# Patient Record
Sex: Male | Born: 1950 | Race: White | Hispanic: No | Marital: Married | Smoking: Former smoker
Health system: Southern US, Community
[De-identification: ages and names within clinical notes are randomized; demographics above are authoritative.]

## PROBLEM LIST (undated history)

## (undated) DIAGNOSIS — C801 Malignant (primary) neoplasm, unspecified: Secondary | ICD-10-CM

## (undated) DIAGNOSIS — I219 Acute myocardial infarction, unspecified: Secondary | ICD-10-CM

## (undated) DIAGNOSIS — I10 Essential (primary) hypertension: Secondary | ICD-10-CM

## (undated) HISTORY — PX: ACHILLES TENDON SURGERY: SHX542

## (undated) HISTORY — PX: KNEE ARTHROSCOPY: SHX127

---

## 1999-05-05 DIAGNOSIS — C801 Malignant (primary) neoplasm, unspecified: Secondary | ICD-10-CM

## 1999-05-05 HISTORY — PX: OTHER SURGICAL HISTORY: SHX169

## 1999-05-05 HISTORY — DX: Malignant (primary) neoplasm, unspecified: C80.1

## 2003-06-28 ENCOUNTER — Ambulatory Visit (HOSPITAL_COMMUNITY): Admission: RE | Admit: 2003-06-28 | Discharge: 2003-06-28 | Payer: Self-pay | Admitting: Gastroenterology

## 2009-02-27 ENCOUNTER — Encounter: Admission: RE | Admit: 2009-02-27 | Discharge: 2009-02-27 | Payer: Self-pay | Admitting: Gastroenterology

## 2010-09-19 NOTE — Op Note (Signed)
NAME:  Andres Richard, Andres Richard                            ACCOUNT NO.:  [id number]   MEDICAL RECORD NO.:  [id number]                   PATIENT TYPE:  AMB   LOCATION:  ENDO                                 FACILITY:  [facility]   PHYSICIAN:  Danise Edge, M.D.                DATE OF BIRTH:  [birth year]   DATE OF PROCEDURE:  06/28/2003  DATE OF DISCHARGE:                                 OPERATIVE REPORT   PROCEDURE:  Screening colonoscopy.   INDICATIONS FOR PROCEDURE:  Mr. Andres Richard is a 60 year old male born  May 24, 1950.  Mr. Andres Richard is scheduled to undergo his first screening  colonoscopy with polypectomy to prevent colon cancer.   ENDOSCOPIST:  Danise Edge, M.D.   PREMEDICATION:  Versed 9.5 mg, Demerol 50 mg.   DESCRIPTION OF PROCEDURE:  After obtaining informed consent, Mr. Andres Richard was  placed in the left lateral decubitus position. I administered intravenous  Demerol and intravenous Versed to achieve conscious sedation for the  procedure. The patient's blood pressure, oxygen saturation and cardiac  rhythm were monitored throughout the procedure and documented in the medical  record.   Anal inspection was normal. Digital rectal examination revealed an absent  prostate. In January 2002, Mr. Andres Richard underwent a radical prostatectomy to  treat prostate cancer.  The Olympus adjustable pediatric colonoscope was  introduced into the rectum and advanced to the cecum. Colonic preparation  for the exam today was excellent.   Mr. Andres Richard has universal colonic diverticulosis without diverticulitis or  diverticular stricture formation.   RECTUM:  Normal.   SIGMOID COLON AND DESCENDING COLON:  Normal.   SPLENIC FLEXURE:  Normal.   TRANSVERSE COLON:  Normal.   HEPATIC FLEXURE:  Normal.   ASCENDING COLON:  Normal.   CECUM AND ILEOCECAL VALVE:  Normal.   ASSESSMENT:  Normal screening proctocolonoscopy to the cecum.  No endoscopic  evidence for the presence of colorectal neoplasia.  Universal  colonic  diverticulosis without diverticulitis or diverticular stricture formation  present.                                               Danise Edge, M.D.    MJ/MEDQ  D:  06/28/2003  T:  06/28/2003  Job:  [id number]   cc:   B. Hamrick, M.D.  [facility], [place]

## 2014-01-29 ENCOUNTER — Other Ambulatory Visit (HOSPITAL_COMMUNITY): Payer: Self-pay | Admitting: Orthopedic Surgery

## 2014-01-30 ENCOUNTER — Encounter (HOSPITAL_COMMUNITY): Payer: Self-pay | Admitting: Pharmacist

## 2014-02-01 ENCOUNTER — Other Ambulatory Visit (HOSPITAL_COMMUNITY): Payer: Self-pay | Admitting: *Deleted

## 2014-02-01 NOTE — Pre-Procedure Instructions (Addendum)
Andres Richard  02/01/2014   Your procedure is scheduled on:  Monday, February 05, 2014    Report to [facility] Entrance "A" Admitting Office at 9:00 AM.   Call this number if you have problems the morning of surgery: [phone]   Remember:   Do not eat food or drink liquids after midnight Sunday, 02/04/14.   Take these medicines the morning of surgery with A SIP OF WATER: atenolol (TENORMIN)  Stop Aspirin as of today.STOP all herbel meds, nsaids (aleve,naproxen,advil,ibuprofen) 5 days prior to surgery including vitamins now   Do not wear jewelry.  Do not wear lotions, powders, or cologne. You may wear deodorant.  Men may shave face and neck.  Do not bring valuables to the hospital.  [facility] is not responsible                  for any belongings or valuables.               Contacts, dentures or bridgework may not be worn into surgery.  Leave suitcase in the car. After surgery it may be brought to your room.  For patients admitted to the hospital, discharge time is determined by your                treatment team.               Patients discharged the day of surgery will not be allowed to drive home.    Special Instructions: Andres Richard - Preparing for Surgery  Before surgery, you can play an important role.  Because skin is not sterile, your skin needs to be as free of germs as possible.  You can reduce the number of germs on you skin by washing with CHG (chlorahexidine gluconate) soap before surgery.  CHG is an antiseptic cleaner which kills germs and bonds with the skin to continue killing germs even after washing.  Please DO NOT use if you have an allergy to CHG or antibacterial soaps.  If your skin becomes reddened/irritated stop using the CHG and inform your nurse when you arrive at Short Stay.  Do not shave (including legs and underarms) for at least 48 hours prior to the first CHG shower.  You may shave your face.  Please follow these instructions carefully:   1.   Shower with CHG Soap the night before surgery and the                                morning of Surgery.  2.  If you choose to wash your hair, wash your hair first as usual with your       normal shampoo.  3.  After you shampoo, rinse your hair and body thoroughly to remove the                      Shampoo.  4.  Use CHG as you would any other liquid soap.  You can apply chg directly       to the skin and wash gently with scrungie or a clean washcloth.  5.  Apply the CHG Soap to your body ONLY FROM THE NECK DOWN.        Do not use on open wounds or open sores.  Avoid contact with your eyes, ears, mouth and genitals (private parts).  Wash genitals (private parts) with your normal soap.  6.  Wash thoroughly, paying special attention to the area where your surgery        will be performed.  7.  Thoroughly rinse your body with warm water from the neck down.  8.  DO NOT shower/wash with your normal soap after using and rinsing off       the CHG Soap.  9.  Pat yourself dry with a clean towel.            10.  Wear clean pajamas.            11.  Place clean sheets on your bed the night of your first shower and do not        sleep with pets.  Day of Surgery  Do not apply any lotions the morning of surgery.  Please wear clean clothes to the hospital/surgery center.     Please read over the following fact sheets that you were given: Pain Booklet, Coughing and Deep Breathing and Surgical Site Infection Prevention

## 2014-02-02 ENCOUNTER — Ambulatory Visit (HOSPITAL_COMMUNITY)
Admission: RE | Admit: 2014-02-02 | Discharge: 2014-02-02 | Disposition: A | Discharge status: Home / Self Care | Payer: 59 | Source: Ambulatory Visit | Attending: Anesthesiology | Admitting: Anesthesiology

## 2014-02-02 ENCOUNTER — Encounter (HOSPITAL_COMMUNITY): Payer: Self-pay

## 2014-02-02 ENCOUNTER — Encounter (HOSPITAL_COMMUNITY)
Admission: RE | Admit: 2014-02-02 | Discharge: 2014-02-02 | Disposition: A | Discharge status: Home / Self Care | Payer: 59 | Source: Ambulatory Visit | Attending: Orthopedic Surgery | Admitting: Orthopedic Surgery

## 2014-02-02 DIAGNOSIS — Z01818 Encounter for other preprocedural examination: Secondary | ICD-10-CM | POA: Diagnosis present

## 2014-02-02 DIAGNOSIS — S83206A Unspecified tear of unspecified meniscus, current injury, right knee, initial encounter: Secondary | ICD-10-CM | POA: Diagnosis not present

## 2014-02-02 DIAGNOSIS — X58XXXD Exposure to other specified factors, subsequent encounter: Secondary | ICD-10-CM | POA: Insufficient documentation

## 2014-02-02 DIAGNOSIS — I10 Essential (primary) hypertension: Secondary | ICD-10-CM | POA: Diagnosis not present

## 2014-02-02 HISTORY — DX: Acute myocardial infarction, unspecified: I21.9

## 2014-02-02 HISTORY — DX: Essential (primary) hypertension: I10

## 2014-02-02 HISTORY — DX: Malignant (primary) neoplasm, unspecified: C80.1

## 2014-02-02 LAB — CBC
HCT: 45.4 % (ref 39.0–52.0)
HEMOGLOBIN: 15.7 g/dL (ref 13.0–17.0)
MCH: 31.2 pg (ref 26.0–34.0)
MCHC: 34.6 g/dL (ref 30.0–36.0)
MCV: 90.3 fL (ref 78.0–100.0)
Platelets: 284 10*3/uL (ref 150–400)
RBC: 5.03 MIL/uL (ref 4.22–5.81)
RDW: 13.3 % (ref 11.5–15.5)
WBC: 7 10*3/uL (ref 4.0–10.5)

## 2014-02-02 LAB — BASIC METABOLIC PANEL
Anion gap: 8 (ref 5–15)
BUN: 12 mg/dL (ref 6–23)
CALCIUM: 8.7 mg/dL (ref 8.4–10.5)
CO2: 28 meq/L (ref 19–32)
Chloride: 104 mEq/L (ref 96–112)
Creatinine, Ser: 0.88 mg/dL (ref 0.50–1.35)
GFR calc Af Amer: >90 mL/min (ref >90)
GFR calc non Af Amer: 90 mL/min — ABNORMAL LOW (ref >90)
GLUCOSE: 102 mg/dL — AB (ref 70–99)
POTASSIUM: 4.5 meq/L (ref 3.7–5.3)
Sodium: 140 mEq/L (ref 137–147)

## 2014-02-02 NOTE — Progress Notes (Addendum)
Patient stated had mi 25 yrs ago in [place]. No cardiologist or heart problems since came to [place] 12 yrs ago. req' notes, any cardiac notes ,tests ,ekg  from dr Daiva Eves at [facility]  in [phone] ph [phone] fax

## 2014-02-02 NOTE — Progress Notes (Signed)
Anesthesia Chart Review:  Pt is 63 year old male scheduled for R knee arthroscopy, debridement vs meniscal repair on 02/05/14 with Dr. Marlou Sa.   PMH: MI in the [place] 25 years ago, HTN, hyperlipidemia,  prostate cancer.   Medications include: ASA, atentolol, lipitor  Preoperative labs reviewed.    Chest x-ray reviewed.   EKG: sinus bradycardia, 48 bpm  PCP is Hamrick at [facility] in [place]. Has not seen cardiologist and denies heart problems since has been in this country for last 12 years.   Contacted PCP's office. No EKG, stress test, or echo on record.  Last office visit with PCP was 03/2013. By notes from last office visit, pt has hx CAD, "mild by cardiac catheterization years ago, he's never had any interventions". MI was dx by elevated cardiac enzymes.   Pt will need further eval by his assigned anesthesiologist on DOS.   Willeen Cass, FNP-BC [facility] Short Stay Surgical Center/Anesthesiology Phone: [phone] 02/02/2014 4:03 PM

## 2014-02-04 MED ORDER — CHLORHEXIDINE GLUCONATE 4 % EX LIQD
60.0000 mL | Freq: Once | CUTANEOUS | Status: DC
  Filled 2014-02-04: qty 60

## 2014-02-04 MED ORDER — CEFAZOLIN SODIUM-DEXTROSE 2-3 GM-% IV SOLR
2.0000 g | INTRAVENOUS | Status: AC
  Administered 2014-02-05: 2 g via INTRAVENOUS

## 2014-02-05 ENCOUNTER — Encounter (HOSPITAL_COMMUNITY): Payer: Self-pay | Admitting: *Deleted

## 2014-02-05 ENCOUNTER — Ambulatory Visit (HOSPITAL_COMMUNITY)
Admission: RE | Admit: 2014-02-05 | Discharge: 2014-02-05 | Disposition: A | Discharge status: Home / Self Care | Payer: 59 | Source: Ambulatory Visit | Attending: Orthopedic Surgery | Admitting: Orthopedic Surgery

## 2014-02-05 ENCOUNTER — Encounter (HOSPITAL_COMMUNITY)
Admission: RE | Disposition: A | Discharge status: Home / Self Care | Payer: Self-pay | Source: Ambulatory Visit | Attending: Orthopedic Surgery

## 2014-02-05 ENCOUNTER — Encounter (HOSPITAL_COMMUNITY): Payer: 59 | Admitting: Emergency Medicine

## 2014-02-05 ENCOUNTER — Ambulatory Visit (HOSPITAL_COMMUNITY): Payer: 59 | Admitting: Anesthesiology

## 2014-02-05 DIAGNOSIS — Z8546 Personal history of malignant neoplasm of prostate: Secondary | ICD-10-CM | POA: Insufficient documentation

## 2014-02-05 DIAGNOSIS — Z79899 Other long term (current) drug therapy: Secondary | ICD-10-CM | POA: Insufficient documentation

## 2014-02-05 DIAGNOSIS — X58XXXA Exposure to other specified factors, initial encounter: Secondary | ICD-10-CM | POA: Insufficient documentation

## 2014-02-05 DIAGNOSIS — Z7982 Long term (current) use of aspirin: Secondary | ICD-10-CM | POA: Diagnosis not present

## 2014-02-05 DIAGNOSIS — S83241A Other tear of medial meniscus, current injury, right knee, initial encounter: Secondary | ICD-10-CM | POA: Insufficient documentation

## 2014-02-05 DIAGNOSIS — I10 Essential (primary) hypertension: Secondary | ICD-10-CM | POA: Insufficient documentation

## 2014-02-05 DIAGNOSIS — Y929 Unspecified place or not applicable: Secondary | ICD-10-CM | POA: Insufficient documentation

## 2014-02-05 DIAGNOSIS — I252 Old myocardial infarction: Secondary | ICD-10-CM | POA: Diagnosis not present

## 2014-02-05 DIAGNOSIS — S83242A Other tear of medial meniscus, current injury, left knee, initial encounter: Secondary | ICD-10-CM

## 2014-02-05 DIAGNOSIS — Z87891 Personal history of nicotine dependence: Secondary | ICD-10-CM | POA: Diagnosis not present

## 2014-02-05 HISTORY — PX: KNEE ARTHROSCOPY WITH MENISCAL REPAIR: SHX5653

## 2014-02-05 SURGERY — ARTHROSCOPY, KNEE, WITH MENISCUS REPAIR
Anesthesia: General | Site: Knee | Laterality: Right

## 2014-02-05 MED ORDER — LIDOCAINE HCL (CARDIAC) 20 MG/ML IV SOLN
INTRAVENOUS | Status: DC | PRN
  Administered 2014-02-05: 80 mg via INTRAVENOUS

## 2014-02-05 MED ORDER — BUPIVACAINE HCL (PF) 0.25 % IJ SOLN
INTRAMUSCULAR | Status: DC | PRN
  Administered 2014-02-05: 10 mL

## 2014-02-05 MED ORDER — PROPOFOL 10 MG/ML IV BOLUS
INTRAVENOUS | Status: AC
  Filled 2014-02-05: qty 20

## 2014-02-05 MED ORDER — PROPOFOL 10 MG/ML IV BOLUS
INTRAVENOUS | Status: DC | PRN
  Administered 2014-02-05: 20 mg via INTRAVENOUS
  Administered 2014-02-05: 30 mg via INTRAVENOUS
  Administered 2014-02-05: 190 mg via INTRAVENOUS

## 2014-02-05 MED ORDER — HYDROMORPHONE HCL 1 MG/ML IJ SOLN: 0.2500 mg | INTRAMUSCULAR | Status: DC | PRN

## 2014-02-05 MED ORDER — DEXTROSE 5 % IV SOLN
INTRAVENOUS | Status: DC | PRN
  Administered 2014-02-05: 12:00:00 via INTRAVENOUS

## 2014-02-05 MED ORDER — CLONIDINE HCL (ANALGESIA) 100 MCG/ML EP SOLN
EPIDURAL | Status: DC | PRN
  Administered 2014-02-05: 1 mL

## 2014-02-05 MED ORDER — MORPHINE SULFATE 4 MG/ML IJ SOLN
INTRAMUSCULAR | Status: DC | PRN
  Administered 2014-02-05: 8 mg

## 2014-02-05 MED ORDER — OXYCODONE-ACETAMINOPHEN 10-325 MG PO TABS: 1.0000 | ORAL_TABLET | Freq: Four times a day (QID) | ORAL | Status: DC | PRN

## 2014-02-05 MED ORDER — ONDANSETRON HCL 4 MG/2ML IJ SOLN
INTRAMUSCULAR | Status: DC | PRN
  Administered 2014-02-05: 4 mg via INTRAVENOUS

## 2014-02-05 MED ORDER — OXYCODONE HCL 5 MG/5ML PO SOLN: 5.0000 mg | Freq: Once | ORAL | Status: DC | PRN

## 2014-02-05 MED ORDER — PROMETHAZINE HCL 25 MG/ML IJ SOLN: 6.2500 mg | INTRAMUSCULAR | Status: DC | PRN

## 2014-02-05 MED ORDER — ARTIFICIAL TEARS OP OINT
TOPICAL_OINTMENT | OPHTHALMIC | Status: AC
  Filled 2014-02-05: qty 3.5

## 2014-02-05 MED ORDER — OXYCODONE HCL 5 MG PO TABS: 5.0000 mg | ORAL_TABLET | Freq: Once | ORAL | Status: DC | PRN

## 2014-02-05 MED ORDER — LACTATED RINGERS IV SOLN
INTRAVENOUS | Status: DC
  Administered 2014-02-05: 09:00:00 via INTRAVENOUS

## 2014-02-05 MED ORDER — LACTATED RINGERS IV SOLN
INTRAVENOUS | Status: DC | PRN
  Administered 2014-02-05 (×2): via INTRAVENOUS

## 2014-02-05 MED ORDER — ARTIFICIAL TEARS OP OINT
TOPICAL_OINTMENT | OPHTHALMIC | Status: DC | PRN
  Administered 2014-02-05: 1 via OPHTHALMIC

## 2014-02-05 MED ORDER — MORPHINE SULFATE 4 MG/ML IJ SOLN
INTRAMUSCULAR | Status: AC
  Filled 2014-02-05: qty 2

## 2014-02-05 MED ORDER — ROPIVACAINE HCL 5 MG/ML IJ SOLN
INTRAMUSCULAR | Status: DC | PRN
Start: 2014-02-05 — End: 2014-02-05
  Administered 2014-02-05: 30 mL via PERINEURAL

## 2014-02-05 MED ORDER — FENTANYL CITRATE 0.05 MG/ML IJ SOLN
INTRAMUSCULAR | Status: AC
  Filled 2014-02-05: qty 5

## 2014-02-05 MED ORDER — CLONIDINE HCL (ANALGESIA) 100 MCG/ML EP SOLN
150.0000 ug | EPIDURAL | Status: AC
  Administered 2014-02-05: 1 mL via INTRA_ARTICULAR
  Filled 2014-02-05: qty 1.5

## 2014-02-05 MED ORDER — BUPIVACAINE HCL (PF) 0.25 % IJ SOLN
INTRAMUSCULAR | Status: AC
  Filled 2014-02-05: qty 30

## 2014-02-05 MED ORDER — FENTANYL CITRATE 0.05 MG/ML IJ SOLN
INTRAMUSCULAR | Status: AC
  Filled 2014-02-05: qty 2

## 2014-02-05 MED ORDER — MIDAZOLAM HCL 2 MG/2ML IJ SOLN
INTRAMUSCULAR | Status: AC
  Filled 2014-02-05: qty 2

## 2014-02-05 MED ORDER — SODIUM CHLORIDE 0.9 % IR SOLN
Status: DC | PRN
  Administered 2014-02-05 (×2): 3000 mL

## 2014-02-05 MED ORDER — ROPIVACAINE HCL 5 MG/ML IJ SOLN
Freq: Once | INTRAMUSCULAR | Status: DC
  Filled 2014-02-05: qty 30

## 2014-02-05 MED ORDER — LIDOCAINE HCL (CARDIAC) 20 MG/ML IV SOLN
INTRAVENOUS | Status: AC
  Filled 2014-02-05: qty 5

## 2014-02-05 SURGICAL SUPPLY — 56 items
BANDAGE ELASTIC 4 VELCRO ST LF (GAUZE/BANDAGES/DRESSINGS) ×3 IMPLANT
BANDAGE ELASTIC 6 VELCRO ST LF (GAUZE/BANDAGES/DRESSINGS) ×3 IMPLANT
BANDAGE ESMARK 6X9 LF (GAUZE/BANDAGES/DRESSINGS) IMPLANT
BLADE CUDA 5.5 (BLADE) IMPLANT
BLADE CUTTER GATOR 3.5 (BLADE) ×3 IMPLANT
BLADE GREAT WHITE 4.2 (BLADE) ×2 IMPLANT
BLADE GREAT WHITE 4.2MM (BLADE) ×1
BLADE SURG ROTATE 9660 (MISCELLANEOUS) IMPLANT
BNDG ESMARK 6X9 LF (GAUZE/BANDAGES/DRESSINGS)
BUR OVAL 6.0 (BURR) IMPLANT
COVER MAYO STAND STRL (DRAPES) ×3 IMPLANT
COVER SURGICAL LIGHT HANDLE (MISCELLANEOUS) ×3 IMPLANT
CUFF TOURNIQUET SINGLE 34IN LL (TOURNIQUET CUFF) ×3 IMPLANT
CUFF TOURNIQUET SINGLE 44IN (TOURNIQUET CUFF) IMPLANT
DRAPE ARTHROSCOPY W/POUCH 114 (DRAPES) ×3 IMPLANT
DRAPE INCISE IOBAN 66X45 STRL (DRAPES) ×3 IMPLANT
DRAPE PROXIMA HALF (DRAPES) IMPLANT
DRAPE U-SHAPE 47X51 STRL (DRAPES) ×3 IMPLANT
DRSG PAD ABDOMINAL 8X10 ST (GAUZE/BANDAGES/DRESSINGS) ×3 IMPLANT
DURAPREP 26ML APPLICATOR (WOUND CARE) ×3 IMPLANT
ELECT REM PT RETURN 9FT ADLT (ELECTROSURGICAL) ×3
ELECTRODE REM PT RTRN 9FT ADLT (ELECTROSURGICAL) ×1 IMPLANT
GAUZE SPONGE 4X4 12PLY STRL (GAUZE/BANDAGES/DRESSINGS) IMPLANT
GAUZE XEROFORM 1X8 LF (GAUZE/BANDAGES/DRESSINGS) ×3 IMPLANT
GLOVE BIOGEL PI IND STRL 8 (GLOVE) ×1 IMPLANT
GLOVE BIOGEL PI INDICATOR 8 (GLOVE) ×2
GLOVE SURG ORTHO 8.0 STRL STRW (GLOVE) ×3 IMPLANT
GOWN STRL REUS W/ TWL LRG LVL3 (GOWN DISPOSABLE) ×2 IMPLANT
GOWN STRL REUS W/ TWL XL LVL3 (GOWN DISPOSABLE) ×1 IMPLANT
GOWN STRL REUS W/TWL LRG LVL3 (GOWN DISPOSABLE) ×4
GOWN STRL REUS W/TWL XL LVL3 (GOWN DISPOSABLE) ×2
KIT BASIN OR (CUSTOM PROCEDURE TRAY) ×3 IMPLANT
KIT ROOM TURNOVER OR (KITS) ×3 IMPLANT
MANIFOLD NEPTUNE II (INSTRUMENTS) IMPLANT
NEEDLE 18GX1X1/2 (RX/OR ONLY) (NEEDLE) IMPLANT
NEEDLE HYPO 25GX1X1/2 BEV (NEEDLE) ×3 IMPLANT
NS IRRIG 1000ML POUR BTL (IV SOLUTION) IMPLANT
PACK ARTHROSCOPY DSU (CUSTOM PROCEDURE TRAY) ×3 IMPLANT
PAD ARMBOARD 7.5X6 YLW CONV (MISCELLANEOUS) ×6 IMPLANT
PADDING CAST COTTON 6X4 STRL (CAST SUPPLIES) ×6 IMPLANT
PENCIL BUTTON HOLSTER BLD 10FT (ELECTRODE) ×3 IMPLANT
SET ARTHROSCOPY TUBING (MISCELLANEOUS) ×2
SET ARTHROSCOPY TUBING LN (MISCELLANEOUS) ×1 IMPLANT
SPONGE GAUZE 4X4 12PLY STER LF (GAUZE/BANDAGES/DRESSINGS) ×3 IMPLANT
SPONGE LAP 4X18 X RAY DECT (DISPOSABLE) ×3 IMPLANT
SUT ETHILON 3 0 PS 1 (SUTURE) ×3 IMPLANT
SUT MENISCAL KIT (KITS) IMPLANT
SYR 20ML ECCENTRIC (SYRINGE) ×3 IMPLANT
SYR CONTROL 10ML LL (SYRINGE) IMPLANT
SYR TB 1ML LUER SLIP (SYRINGE) ×3 IMPLANT
TOWEL OR 17X24 6PK STRL BLUE (TOWEL DISPOSABLE) ×3 IMPLANT
TOWEL OR 17X26 10 PK STRL BLUE (TOWEL DISPOSABLE) ×3 IMPLANT
TUBE CONNECTING 12'X1/4 (SUCTIONS) ×1
TUBE CONNECTING 12X1/4 (SUCTIONS) ×2 IMPLANT
WAND HAND CNTRL MULTIVAC 90 (MISCELLANEOUS) IMPLANT
WATER STERILE IRR 1000ML POUR (IV SOLUTION) ×3 IMPLANT

## 2014-02-05 NOTE — H&P (Signed)
Andres Richard is an 63 y.o. male.   Chief Complaint: right knee pain HPI: pt describes multiple month h/o right knee pain and mechanical sxs refractory to non op mngmt. He does describe med JLT  And locking sxs - mri shows predominantly vertical tera involving substantial part of meniscus  Past Medical History  Diagnosis Date  . Myocardial infarction     25 yrs ago in [place]  . Hypertension   . Cancer     prostate    Past Surgical History  Procedure Laterality Date  . Prostate ca  02  . Achilles tendon surgery Bilateral     11-11 yrs old  . Knee arthroscopy Bilateral     ? dates    History reviewed. No pertinent family history. Social History:  reports that he quit smoking about 26 years ago. He does not have any smokeless tobacco history on file. He reports that he drinks about 8.4 ounces of alcohol per week. He reports that he does not use illicit drugs.  Allergies: Not on File  Medications Prior to Admission  Medication Sig Dispense Refill  . aspirin 81 MG tablet Take 81 mg by mouth daily.      Marland Kitchen atenolol (TENORMIN) 25 MG tablet Take 25 mg by mouth daily.      Marland Kitchen atorvastatin (LIPITOR) 20 MG tablet Take 20 mg by mouth every evening.        No results found for this or any previous visit (from the past 48 hour(s)). No results found.  Review of Systems  Constitutional: Negative.   HENT: Negative.   Eyes: Negative.   Respiratory: Negative.   Cardiovascular: Negative.   Gastrointestinal: Negative.   Genitourinary: Negative.   Musculoskeletal: Positive for joint pain.  Skin: Negative.   Neurological: Negative.   Endo/Heme/Allergies: Negative.   Psychiatric/Behavioral: Negative.     Blood pressure 155/73, pulse 46, temperature 97 F (36.1 C), temperature source Oral, resp. rate 20, weight 88.451 kg (195 lb), SpO2 100.00%. Physical Exam  Constitutional: He appears well-developed.  HENT:  Head: Normocephalic.  Eyes: Pupils are equal, round, and reactive to light.   Neck: Normal range of motion.  Cardiovascular: Normal rate.   Respiratory: Effort normal.  Neurological: He is alert.  Skin: Skin is warm.  Psychiatric: He has a normal mood and affect.   right knee with MJLT and pos mcmurray - extensor mech ok - dp 2/4 - ankle df pf ok - rom full - collaterals and cruciates intact  Assessment/Plan Right knee medial meniscal tear with configuration potentially amenable to repair - plan doa and debridement vs inside out repair - risks and benefits discussed with patient included but not limited to infxn - n/v damage - stiffness - and repair failure requiring further surgery - all questions answered  DEAN,GREGORY SCOTT 02/05/2014, 10:08 AM

## 2014-02-05 NOTE — Progress Notes (Signed)
Orthopedic Tech Progress Note Patient Details:  Andres Richard 12-10-1950 [id number] Crutches delivered to PACU and adjusted to patient's charted height. Patient unable to stand at this time for crutch training. Should patient need additional help with crutches PACU nurse is to call Ortho tech back once patient is able to stand. Ortho Devices Type of Ortho Device: Crutches Ortho Device/Splint Interventions: Ordered   [place] R Thompson 02/05/2014, 2:17 PM

## 2014-02-05 NOTE — Brief Op Note (Signed)
02/05/2014  1:13 PM  PATIENT:  Andres Richard  63 y.o. male  PRE-OPERATIVE DIAGNOSIS:  RIGHT KNEE MEDIAL MENISCAL TEAR  POST-OPERATIVE DIAGNOSIS:  RIGHT KNEE MEDIAL MENISCAL TEAR  PROCEDURE:  Procedure(s): KNEE ARTHROSCOPY WITH DEBRIDEMENT  SURGEON:  Surgeon(s): Meredith Pel, MD  ASSISTANT: s vernon pa  ANESTHESIA:   general  EBL: 10 ml    Total I/O In: 50 [I.V.:50] Out: -   BLOOD ADMINISTERED: none  DRAINS: none   LOCAL MEDICATIONS USED:  none  SPECIMEN:  No Specimen  COUNTS:  YES  TOURNIQUET:   Total Tourniquet Time Documented: Thigh (Right) - 52 minutes Total: Thigh (Right) - 52 minutes   DICTATION: .Other Dictation: Dictation Number [id number]  PLAN OF CARE: Discharge to home after PACU  PATIENT DISPOSITION:  PACU - hemodynamically stable

## 2014-02-05 NOTE — Anesthesia Procedure Notes (Addendum)
Anesthesia Regional Block:  Adductor canal block  Pre-Anesthetic Checklist: ,, timeout performed,, Correct Site, Correct Laterality, Correct Procedure, Correct Position, site marked, risks and benefits discussed, Surgical consent,  Pre-op evaluation,  At surgeon's request and post-op pain management   Prep: chloraprep       Needles:   Needle Type: Echogenic Needle      Needle Gauge: 21 and 21 G  Needle insertion depth: 5 cm   Additional Needles:  Procedures: ultrasound guided (picture in chart) Adductor canal block Narrative:  Start time: 02/05/2014 11:00 AM End time: 02/05/2014 11:15 AM Injection made incrementally with aspirations every 5 mL.  Performed by: Personally  Anesthesiologist: T Massagee  Additional Notes: Tolerated well   Procedure Name: LMA Insertion Date/Time: 02/05/2014 11:37 AM Performed by: Williemae Area B Pre-anesthesia Checklist: Patient identified, Emergency Drugs available, Suction available and Patient being monitored Patient Re-evaluated:Patient Re-evaluated prior to inductionOxygen Delivery Method: Circle system utilized Preoxygenation: Pre-oxygenation with 100% oxygen Intubation Type: IV induction Ventilation: Mask ventilation without difficulty LMA: LMA inserted LMA Size: 5.0 Number of attempts: 1 Placement Confirmation: breath sounds checked- equal and bilateral and positive ETCO2 Tube secured with: Tape (taped across cheeks) Dental Injury: Teeth and Oropharynx as per pre-operative assessment

## 2014-02-05 NOTE — Discharge Instructions (Signed)

## 2014-02-05 NOTE — Transfer of Care (Signed)
Immediate Anesthesia Transfer of Care Note  Patient: Andres Richard  Procedure(s) Performed: Procedure(s): KNEE ARTHROSCOPY WITH DEBRIDEMENT (Right)  Patient Location: PACU  Anesthesia Type:General  Level of Consciousness: awake, alert  and patient cooperative  Airway & Oxygen Therapy: Patient Spontanous Breathing and Patient connected to nasal cannula oxygen  Post-op Assessment: Report given to PACU RN, Post -op Vital signs reviewed and stable and Patient moving all extremities  Post vital signs: Reviewed and stable  Complications: No apparent anesthesia complications

## 2014-02-05 NOTE — Anesthesia Preprocedure Evaluation (Signed)
Anesthesia Evaluation  Patient identified by MRN, date of birth, ID band Patient awake    Reviewed: Allergy & Precautions, H&P , NPO status   History of Anesthesia Complications Negative for: history of anesthetic complications  Airway Mallampati: II  Neck ROM: Full    Dental  (+) Teeth Intact   Pulmonary former smoker,  breath sounds clear to auscultation        Cardiovascular hypertension, + Past MI Rhythm:Regular Rate:Normal     Neuro/Psych    GI/Hepatic   Endo/Other    Renal/GU      Musculoskeletal   Abdominal   Peds  Hematology   Anesthesia Other Findings   Reproductive/Obstetrics                           Anesthesia Physical Anesthesia Plan  ASA: III  Anesthesia Plan: General   Post-op Pain Management:    Induction: Intravenous  Airway Management Planned: LMA  Additional Equipment:   Intra-op Plan:   Post-operative Plan: Extubation in OR  Informed Consent: I have reviewed the patients History and Physical, chart, labs and discussed the procedure including the risks, benefits and alternatives for the proposed anesthesia with the patient or authorized representative who has indicated his/her understanding and acceptance.   Dental advisory given  Plan Discussed with: CRNA and Surgeon  Anesthesia Plan Comments:         Anesthesia Quick Evaluation

## 2014-02-06 NOTE — Op Note (Signed)
NAME:  LE, [name] NO.:  [id number]  MEDICAL RECORD NO.:  [id number]  LOCATION:  [facility]                         FACILITY:  [facility]  PHYSICIAN:  Anderson Malta, M.D.    DATE OF BIRTH:  1950-09-26  DATE OF PROCEDURE:  02/05/2014 DATE OF DISCHARGE:  02/05/2014                              OPERATIVE REPORT   PREOPERATIVE DIAGNOSIS:  Right knee medial meniscal tear.  POSTOPERATIVE DIAGNOSIS:  Right knee medial meniscal tear.  PROCEDURE:  Right knee arthroscopy, medial meniscal tear debridement, chondroplasty, medial femoral condyle.  SURGEON:  Anderson Malta, M.D.  ASSISTANT:  Epimenio Foot, PA-C  ANESTHESIA:  General.  INDICATIONS:  Andres Richard is a patient with right knee pain and locking, who presents for operative management of meniscal tear.  MRI scan suggested meniscal tear might be repairable, presents now for operative management after explanation of risks and benefits.  OPERATIVE FINDINGS: 1. Examination under anesthesia.  Under anesthesia, the patient did     actually have about a 5 degree flexion contracture right versus     left.  Flexion was full.  Collateral crucial ligaments were stable.     No posterolateral rotatory instability was noted. 2. Diagnostic arthroscopy: 3. 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 4. No loose bodies of medial and lateral gutter 5. Intact lateral compartment of articular cartilage and meniscus. 6. Intact ACL and PCL, but with osteophytes over groin in the     intercondylar notch. 7. 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 8. Meniscal tear degenerative in nature, unrepairable with the     significant fraying present.  PROCEDURE IN DETAIL:  The patient was brought to the operating room where general anesthesia was induced.  Preop antibiotics were administered.  Time-out was  called.  Right leg was prescrubbed with alcohol and Betadine.  Right leg was prescrubbed with alcohol and Betadine, allowed to air dry, prepped with DuraPrep solution and draped in sterile manner.  Leg was elevated and exsanguinated with an Esmarch wrap.  Tourniquet was inflated.  Anterior inferolateral portal was established.  Anterior inferior medial portal was established after direct visualization.  Diagnostic arthroscopy was performed.  The plan was for meniscal repair if possible.  However, there was significant amount of arthritis in the medial compartment, femoral condyle along with significant fraying and tearing of the meniscus, made repair impossible.  At this time, the meniscal tear was debrided back to a stable rim, used combination of basket punch and shavers.  In general, the patient had about 25% of his meniscal rim left posteriorly. Chondroplasty also performed on the medial femoral condyle with loose chondral flaps.  There was no chondral tissue left on the undersurface of the patella and the landing zone of the trochlea.  The lateral compartment was intact.  Following a debridement of the meniscal tear, thorough irrigation was performed.  The meniscal rim which remained was stable.  At this time, solution of Marcaine, morphine and clonidine injected into the knee and the portals closed  using 3-0 nylon.  Bulky wrap was applied.  The patient tolerated the procedure well without immediate complication.     Anderson Malta, M.D.     GSD/MEDQ  D:  02/05/2014  T:  02/06/2014  Job:  [id number]

## 2014-02-07 NOTE — Anesthesia Postprocedure Evaluation (Signed)
  Anesthesia Post-op Note  Patient: Andres Richard  Procedure(s) Performed: Procedure(s): KNEE ARTHROSCOPY WITH DEBRIDEMENT (Right)  Patient Location: PACU  Anesthesia Type:GA combined with regional for post-op pain  Level of Consciousness: awake and alert   Airway and Oxygen Therapy: Patient Spontanous Breathing  Post-op Pain: mild  Post-op Assessment: Post-op Vital signs reviewed  Post-op Vital Signs: stable  Last Vitals:  Filed Vitals:   02/05/14 1423  BP: 130/77  Pulse: 69  Temp:   Resp: 15    Complications: No apparent anesthesia complications

## 2014-02-08 ENCOUNTER — Encounter (HOSPITAL_COMMUNITY): Payer: Self-pay | Admitting: Orthopedic Surgery

## 2014-06-13 ENCOUNTER — Other Ambulatory Visit: Payer: Self-pay | Admitting: Gastroenterology

## 2014-08-23 ENCOUNTER — Encounter (HOSPITAL_COMMUNITY): Payer: Self-pay | Admitting: *Deleted

## 2014-09-02 ENCOUNTER — Encounter (HOSPITAL_COMMUNITY): Payer: Self-pay | Admitting: Anesthesiology

## 2014-09-02 NOTE — Anesthesia Preprocedure Evaluation (Signed)
Anesthesia Evaluation  Patient identified by MRN, date of birth, ID band Patient awake    Reviewed: Allergy & Precautions, NPO status , Patient's Chart, lab work & pertinent test results  Airway Mallampati: II  TM Distance: >3 FB Neck ROM: Full    Dental no notable dental hx.    Pulmonary former smoker,  breath sounds clear to auscultation  Pulmonary exam normal       Cardiovascular hypertension, Pt. on medications and Pt. on home beta blockers + Past MI Rhythm:Regular Rate:Normal     Neuro/Psych negative neurological ROS  negative psych ROS   GI/Hepatic negative GI ROS, Neg liver ROS,   Endo/Other  negative endocrine ROS  Renal/GU negative Renal ROS  negative genitourinary   Musculoskeletal negative musculoskeletal ROS (+)   Abdominal   Peds negative pediatric ROS (+)  Hematology negative hematology ROS (+)   Anesthesia Other Findings   Reproductive/Obstetrics negative OB ROS                             Anesthesia Physical Anesthesia Plan  ASA: III  Anesthesia Plan: MAC   Post-op Pain Management:    Induction: Intravenous  Airway Management Planned:   Additional Equipment:   Intra-op Plan:   Post-operative Plan:   Informed Consent: I have reviewed the patients History and Physical, chart, labs and discussed the procedure including the risks, benefits and alternatives for the proposed anesthesia with the patient or authorized representative who has indicated his/her understanding and acceptance.   Dental advisory given  Plan Discussed with: CRNA  Anesthesia Plan Comments:         Anesthesia Quick Evaluation

## 2014-09-03 ENCOUNTER — Encounter (HOSPITAL_COMMUNITY)
Admission: RE | Disposition: A | Discharge status: Home / Self Care | Payer: Self-pay | Source: Ambulatory Visit | Attending: Gastroenterology

## 2014-09-03 ENCOUNTER — Ambulatory Visit (HOSPITAL_COMMUNITY): Payer: 59 | Admitting: Anesthesiology

## 2014-09-03 ENCOUNTER — Ambulatory Visit (HOSPITAL_COMMUNITY)
Admission: RE | Admit: 2014-09-03 | Discharge: 2014-09-03 | Disposition: A | Discharge status: Home / Self Care | Payer: 59 | Source: Ambulatory Visit | Attending: Gastroenterology | Admitting: Gastroenterology

## 2014-09-03 ENCOUNTER — Encounter (HOSPITAL_COMMUNITY): Payer: Self-pay

## 2014-09-03 DIAGNOSIS — I2510 Atherosclerotic heart disease of native coronary artery without angina pectoris: Secondary | ICD-10-CM | POA: Diagnosis not present

## 2014-09-03 DIAGNOSIS — K5730 Diverticulosis of large intestine without perforation or abscess without bleeding: Secondary | ICD-10-CM | POA: Insufficient documentation

## 2014-09-03 DIAGNOSIS — D122 Benign neoplasm of ascending colon: Secondary | ICD-10-CM | POA: Insufficient documentation

## 2014-09-03 DIAGNOSIS — Z87891 Personal history of nicotine dependence: Secondary | ICD-10-CM | POA: Diagnosis not present

## 2014-09-03 DIAGNOSIS — M109 Gout, unspecified: Secondary | ICD-10-CM | POA: Diagnosis not present

## 2014-09-03 DIAGNOSIS — Z09 Encounter for follow-up examination after completed treatment for conditions other than malignant neoplasm: Secondary | ICD-10-CM | POA: Diagnosis present

## 2014-09-03 DIAGNOSIS — Z79899 Other long term (current) drug therapy: Secondary | ICD-10-CM | POA: Insufficient documentation

## 2014-09-03 DIAGNOSIS — Z86010 Personal history of colonic polyps: Secondary | ICD-10-CM | POA: Insufficient documentation

## 2014-09-03 DIAGNOSIS — I10 Essential (primary) hypertension: Secondary | ICD-10-CM | POA: Diagnosis not present

## 2014-09-03 DIAGNOSIS — Z8546 Personal history of malignant neoplasm of prostate: Secondary | ICD-10-CM | POA: Insufficient documentation

## 2014-09-03 DIAGNOSIS — Z9079 Acquired absence of other genital organ(s): Secondary | ICD-10-CM | POA: Diagnosis not present

## 2014-09-03 DIAGNOSIS — I252 Old myocardial infarction: Secondary | ICD-10-CM | POA: Insufficient documentation

## 2014-09-03 HISTORY — PX: COLONOSCOPY WITH PROPOFOL: SHX5780

## 2014-09-03 SURGERY — COLONOSCOPY WITH PROPOFOL
Anesthesia: Monitor Anesthesia Care

## 2014-09-03 MED ORDER — PROPOFOL 10 MG/ML IV BOLUS
INTRAVENOUS | Status: AC
  Filled 2014-09-03: qty 20

## 2014-09-03 MED ORDER — PROPOFOL 10 MG/ML IV BOLUS
INTRAVENOUS | Status: DC | PRN
  Administered 2014-09-03: 5 mg via INTRAVENOUS

## 2014-09-03 MED ORDER — SODIUM CHLORIDE 0.9 % IV SOLN: INTRAVENOUS | Status: DC

## 2014-09-03 MED ORDER — LIDOCAINE HCL (CARDIAC) 20 MG/ML IV SOLN
INTRAVENOUS | Status: AC
  Filled 2014-09-03: qty 5

## 2014-09-03 MED ORDER — PROPOFOL INFUSION 10 MG/ML OPTIME
INTRAVENOUS | Status: DC | PRN
  Administered 2014-09-03: 120 ug/kg/min via INTRAVENOUS

## 2014-09-03 MED ORDER — LACTATED RINGERS IV SOLN
INTRAVENOUS | Status: DC | PRN
  Administered 2014-09-03: 09:00:00 via INTRAVENOUS

## 2014-09-03 MED ORDER — LIDOCAINE HCL (CARDIAC) 20 MG/ML IV SOLN
INTRAVENOUS | Status: DC | PRN
  Administered 2014-09-03: 50 mg via INTRAVENOUS

## 2014-09-03 SURGICAL SUPPLY — 21 items

## 2014-09-03 NOTE — H&P (Signed)
  Procedure: Surveillance colonoscopy. 06/18/2009 colonoscopy with removal of a 7 mm adenomatous cecal polyp.  History: The patient is a 64 year old male born April 09, 1951. He is scheduled to undergo a surveillance colonoscopy today.  Medication allergies: None  Past medical history: Prostate cancer surgery performed in 2002. Hypercholesterolemia. Hypertension. Gout. Coronary artery disease complicated by myocardial infarction in 1997.  Exam: The patient is alert and lying comfortably on the endoscopy stretcher. Abdomen is soft and nontender to palpation. Lungs are clear to auscultation. Cardiac exam reveals a regular rhythm.  Plan: Proceed with surveillance colonoscopy

## 2014-09-03 NOTE — Anesthesia Postprocedure Evaluation (Signed)
  Anesthesia Post-op Note  Patient: Andres Richard  Procedure(s) Performed: Procedure(s) (LRB): COLONOSCOPY WITH PROPOFOL (N/A)  Patient Location: PACU  Anesthesia Type: MAC  Level of Consciousness: awake and alert   Airway and Oxygen Therapy: Patient Spontanous Breathing  Post-op Pain: mild  Post-op Assessment: Post-op Vital signs reviewed, Patient's Cardiovascular Status Stable, Respiratory Function Stable, Patent Airway and No signs of Nausea or vomiting  Last Vitals:  Filed Vitals:   09/03/14 1110  BP:   Pulse: 53  Temp:   Resp: 14    Post-op Vital Signs: stable   Complications: No apparent anesthesia complications

## 2014-09-03 NOTE — Transfer of Care (Signed)
Immediate Anesthesia Transfer of Care Note  Patient: Andres Richard  Procedure(s) Performed: Procedure(s): COLONOSCOPY WITH PROPOFOL (N/A)  Patient Location: PACU  Anesthesia Type:MAC  Level of Consciousness: awake, alert  and oriented  Airway & Oxygen Therapy: Patient Spontanous Breathing and Patient connected to face mask oxygen  Post-op Assessment: Report given to RN and Post -op Vital signs reviewed and stable  Post vital signs: Reviewed and stable  Last Vitals:  Filed Vitals:   09/03/14 0852  BP: 135/61  Pulse: 55  Temp: 36.6 C  Resp: 11    Complications: No apparent anesthesia complications

## 2014-09-03 NOTE — Discharge Instructions (Signed)
Colonoscopy, Care After °These instructions give you information on caring for yourself after your procedure. Your doctor may also give you more specific instructions. Call your doctor if you have any problems or questions after your procedure. °HOME CARE °· Do not drive for 24 hours. °· Do not sign important papers or use machinery for 24 hours. °· You may shower. °· You may go back to your usual activities, but go slower for the first 24 hours. °· Take rest breaks often during the first 24 hours. °· Walk around or use warm packs on your belly (abdomen) if you have belly cramping or gas. °· Drink enough fluids to keep your pee (urine) clear or pale yellow. °· Resume your normal diet. Avoid heavy or fried foods. °· Avoid drinking alcohol for 24 hours or as told by your doctor. °· Only take medicines as told by your doctor. °If a tissue sample (biopsy) was taken during the procedure:  °· Do not take aspirin or blood thinners for 7 days, or as told by your doctor. °· Do not drink alcohol for 7 days, or as told by your doctor. °· Eat soft foods for the first 24 hours. °GET HELP IF: °You still have a small amount of blood in your poop (stool) 2-3 days after the procedure. °GET HELP RIGHT AWAY IF: °· You have more than a small amount of blood in your poop. °· You see clumps of tissue (blood clots) in your poop. °· Your belly is puffy (swollen). °· You feel sick to your stomach (nauseous) or throw up (vomit). °· You have a fever. °· You have belly pain that gets worse and medicine does not help. °MAKE SURE YOU: °· Understand these instructions. °· Will watch your condition. °· Will get help right away if you are not doing well or get worse. °Document Released: 05/23/2010 Document Revised: 04/25/2013 Document Reviewed: 12/26/2012 °[employer]® Patient Information ©2015 [employer]. This information is not intended to replace advice given to you by your health care provider. Make sure you discuss any questions you have with  your health care provider. ° °

## 2014-09-03 NOTE — Op Note (Signed)
Procedure: Surveillance colonoscopy. 06/18/2009 colonoscopy performed with removal of a 7 mm adenomatous cecal polyp  Endoscopist: Earle Gell  Premedication: Propofol administered by anesthesia  Procedure: The patient was placed in the left lateral decubitus position. Anal inspection and digital rectal exam were normal. He has undergone a prostatectomy for prostate cancer. The Pentax pediatric colonoscope was introduced into the rectum and advanced to the cecum. A normal-appearing appendiceal orifice and ileocecal valve were identified. Colonic preparation for the exam today was good. Withdrawal time was 18 minutes  Rectum. Normal. Retroflexed view of the distal normal  Sigmoid colon and descending colon. Normal  Splenic flexure. Normal  Transverse colon. Normal  Hepatic flexure. Normal  Ascending colon. Two 3 mm sessile polyps were removed with the cold biopsy forceps and a 5 mm sessile polyp was removed with the cold snare  Cecum and ileocecal valve. Normal  Universal colonic diverticulosis was present  Assessment:  #1. Three diminutive polyps were removed from the ascending colon  #2. Universal colonic diverticulosis  Recommendation: Schedule surveillance colonoscopy in 5 years

## 2014-09-04 ENCOUNTER — Encounter (HOSPITAL_COMMUNITY): Payer: Self-pay | Admitting: Gastroenterology

## 2015-07-11 IMAGING — CR DG CHEST 2V
2 series · 2 of 2 positions shown · non-contrast
Comparison: None.

CLINICAL DATA: 62-year-old male with hypertension. Preoperative
knee exam for knee surgery.

EXAM:
CHEST - 2 VIEW

[w chest pa]
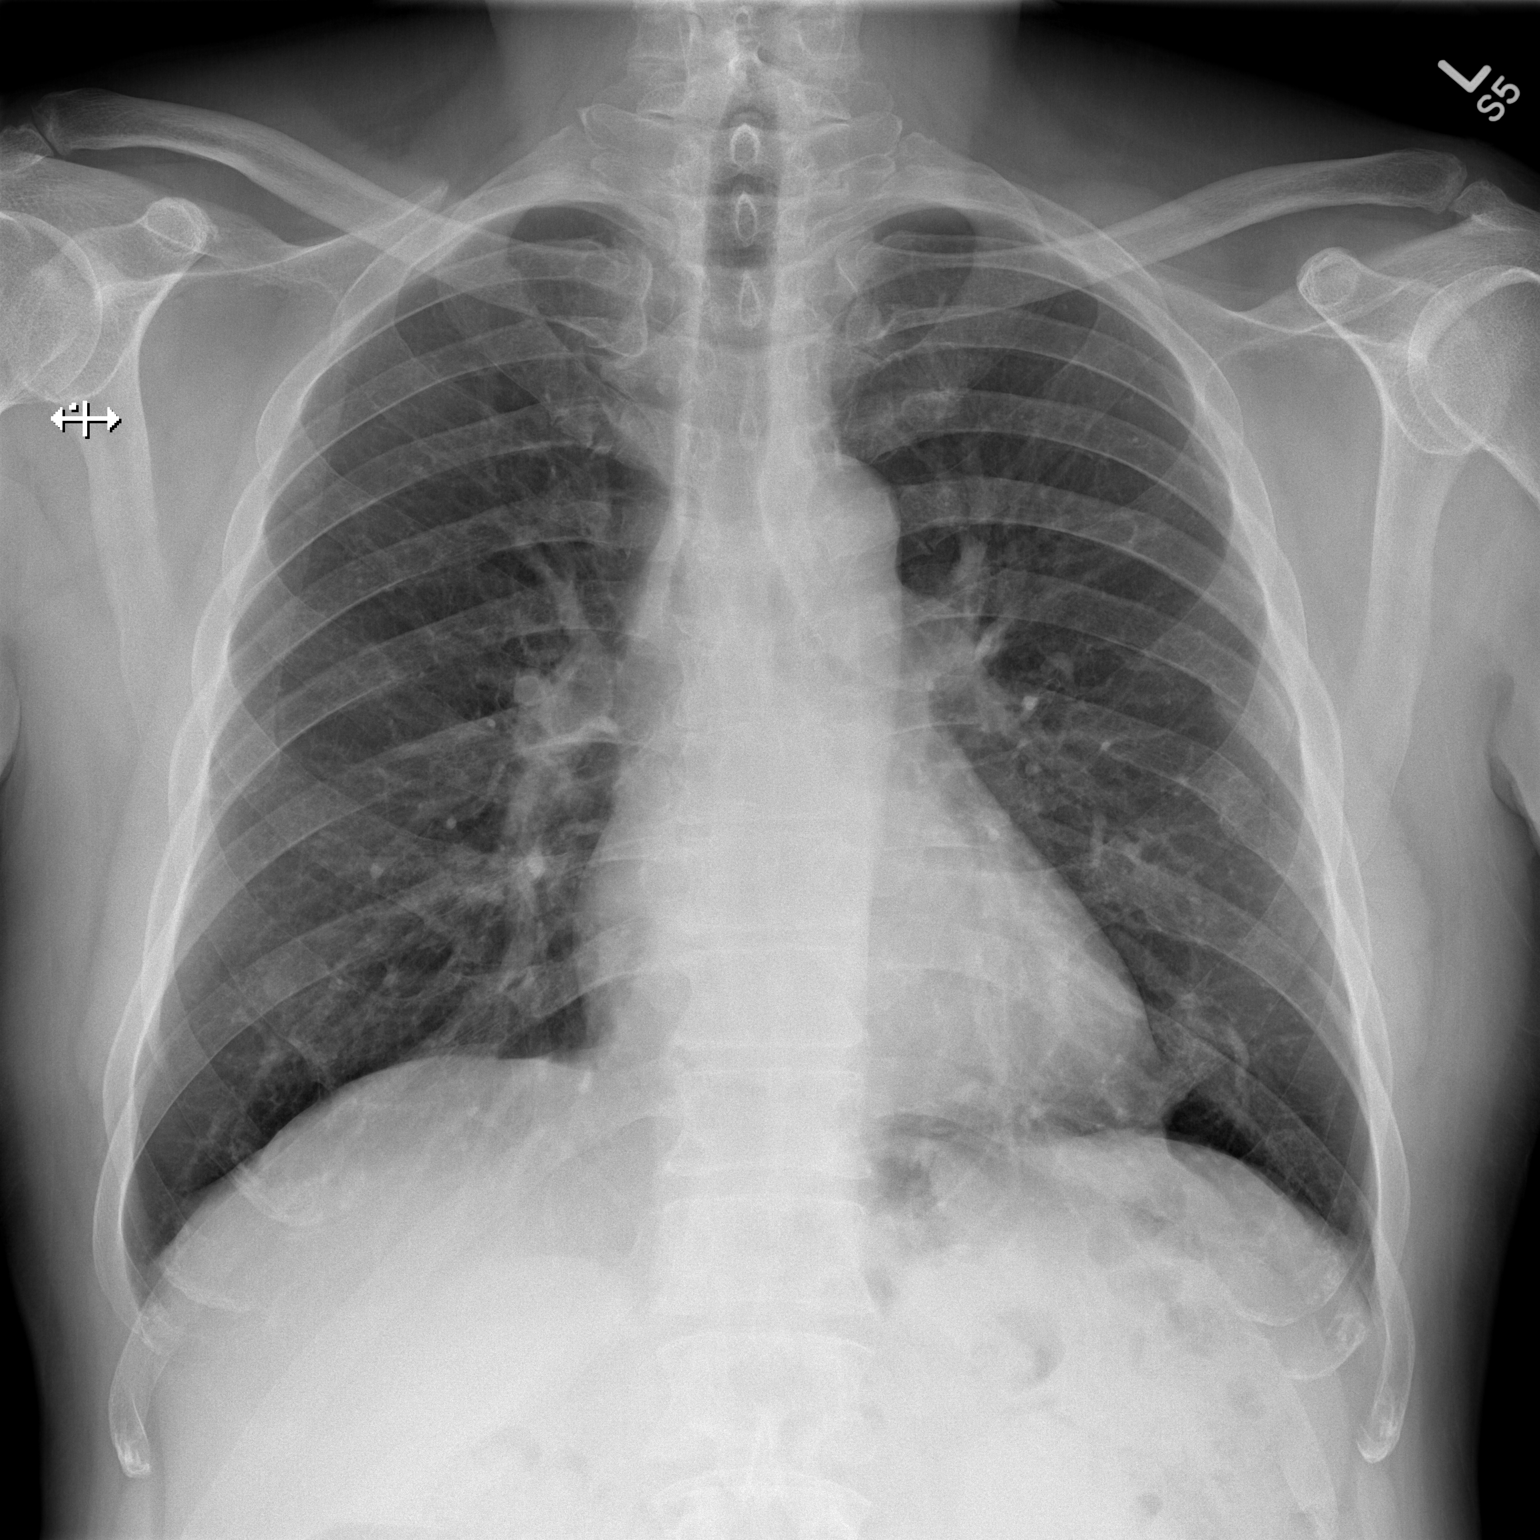

[w chest lat]
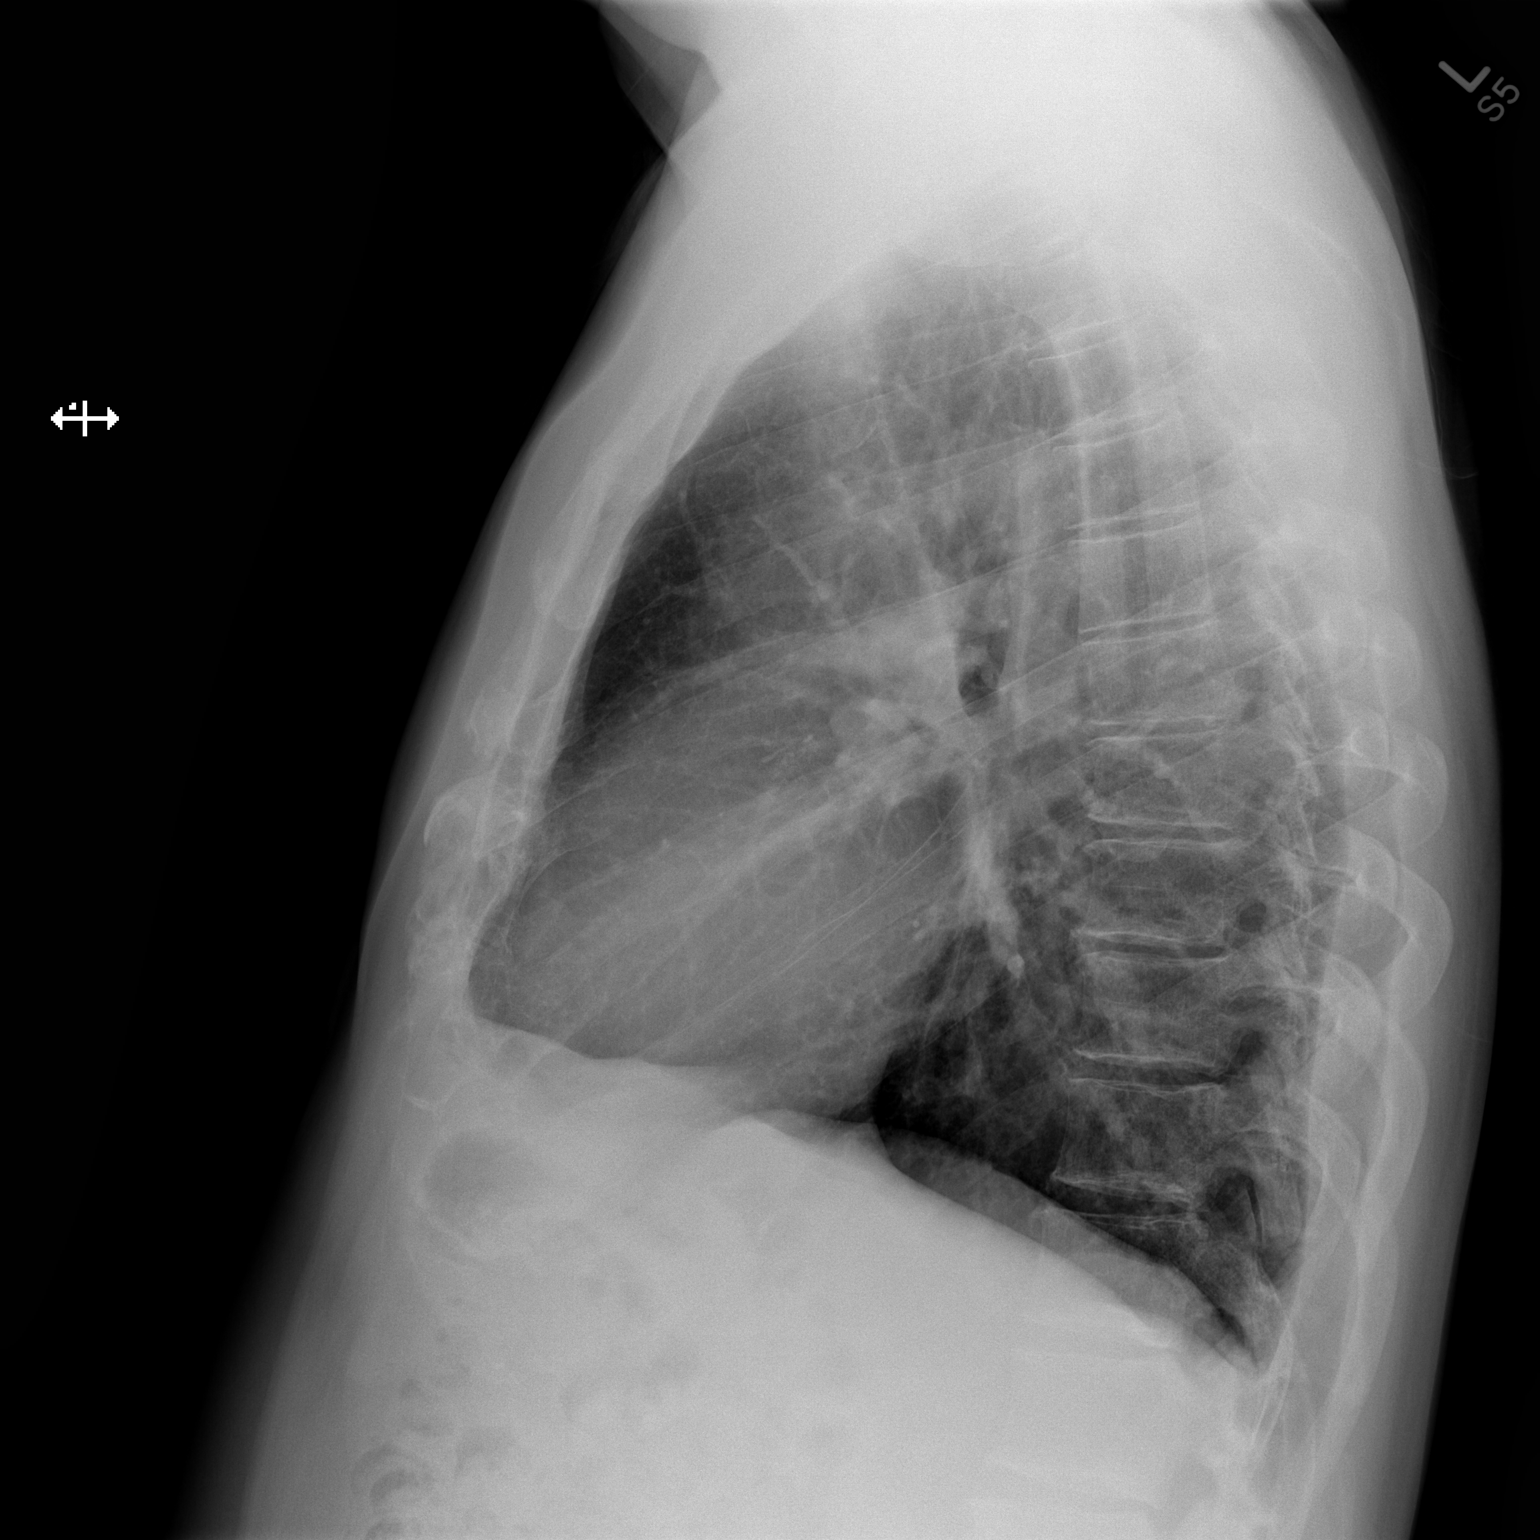

[2 of 2 positions shown; findings below may reference images not displayed]

FINDINGS: Cardiomediastinal silhouette projects within normal limits in size
and contour. No confluent airspace disease, pneumothorax, or pleural
effusion.

No displaced fracture.

Unremarkable appearance of the upper abdomen.
IMPRESSION: No radiographic evidence of acute cardiopulmonary disease.
